# Patient Record
Sex: Male | Born: 1990 | Race: Black or African American | Hispanic: No | Marital: Single | Smoking: Current every day smoker
Health system: Southern US, Community
[De-identification: ages and names within clinical notes are randomized; demographics above are authoritative.]

## PROBLEM LIST (undated history)

## (undated) DIAGNOSIS — I10 Essential (primary) hypertension: Secondary | ICD-10-CM

---

## 2010-07-07 ENCOUNTER — Emergency Department: Payer: Self-pay | Admitting: Emergency Medicine

## 2018-11-28 ENCOUNTER — Encounter: Payer: Self-pay | Admitting: Emergency Medicine

## 2018-11-28 ENCOUNTER — Other Ambulatory Visit: Payer: Self-pay

## 2018-11-28 ENCOUNTER — Emergency Department
Admission: EM | Admit: 2018-11-28 | Discharge: 2018-11-28 | Disposition: A | Discharge status: Home / Self Care | Payer: Self-pay | Attending: Emergency Medicine | Admitting: Emergency Medicine

## 2018-11-28 DIAGNOSIS — Y9389 Activity, other specified: Secondary | ICD-10-CM | POA: Insufficient documentation

## 2018-11-28 DIAGNOSIS — S0083XA Contusion of other part of head, initial encounter: Secondary | ICD-10-CM

## 2018-11-28 DIAGNOSIS — Y92009 Unspecified place in unspecified non-institutional (private) residence as the place of occurrence of the external cause: Secondary | ICD-10-CM | POA: Insufficient documentation

## 2018-11-28 DIAGNOSIS — S0181XA Laceration without foreign body of other part of head, initial encounter: Secondary | ICD-10-CM | POA: Insufficient documentation

## 2018-11-28 DIAGNOSIS — Z880 Allergy status to penicillin: Secondary | ICD-10-CM | POA: Insufficient documentation

## 2018-11-28 DIAGNOSIS — S60211A Contusion of right wrist, initial encounter: Secondary | ICD-10-CM | POA: Insufficient documentation

## 2018-11-28 DIAGNOSIS — W501XXA Accidental kick by another person, initial encounter: Secondary | ICD-10-CM | POA: Insufficient documentation

## 2018-11-28 DIAGNOSIS — Y999 Unspecified external cause status: Secondary | ICD-10-CM | POA: Insufficient documentation

## 2018-11-28 DIAGNOSIS — F17200 Nicotine dependence, unspecified, uncomplicated: Secondary | ICD-10-CM | POA: Insufficient documentation
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
## 2018-11-28 NOTE — ED Notes (Signed)
Cleaned 2cm laceration on patients forehead with clorhexidine and normal saline. Pt tolerated procedure well.

## 2018-11-28 NOTE — Discharge Instructions (Addendum)
The laceration on your forehead was closed with skin adhesive.  Please read through the included information about appropriate wound care.  In short, try to keep it dry for at least 24 hours and after that you may shower but do not scrub out the wound.  The adhesive should fall off in a week or more.  If it falls off early, just allow the wound to heal naturally.  You can use over-the-counter ibuprofen and/or Tylenol as needed for discomfort and if possible you can use cold packs on the contusion on your wrist.  It would be better to wait at least 24 hours before using the cold packs on your forehead so that you avoid getting the adhesive wet, but if it is possible to put on a cold compress without any moisture you can do so on the forehead.

## 2018-11-28 NOTE — ED Notes (Signed)
Pt unable to sign discharge, he is in custody and handcuffed. Pt left ED in custody of [employer].
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
## 2019-05-19 ENCOUNTER — Ambulatory Visit (LOCAL_COMMUNITY_HEALTH_CENTER): Payer: Self-pay

## 2019-05-19 ENCOUNTER — Other Ambulatory Visit: Payer: Self-pay

## 2019-05-19 DIAGNOSIS — Z111 Encounter for screening for respiratory tuberculosis: Secondary | ICD-10-CM

## 2019-05-22 ENCOUNTER — Ambulatory Visit (LOCAL_COMMUNITY_HEALTH_CENTER): Payer: Self-pay

## 2019-05-22 ENCOUNTER — Other Ambulatory Visit: Payer: Self-pay

## 2019-05-22 DIAGNOSIS — Z111 Encounter for screening for respiratory tuberculosis: Secondary | ICD-10-CM

## 2019-05-22 LAB — TB SKIN TEST
Induration: 0 mm
TB Skin Test: NEGATIVE

## 2019-06-25 ENCOUNTER — Ambulatory Visit: Payer: Self-pay

## 2020-04-28 ENCOUNTER — Emergency Department: Payer: Self-pay

## 2020-04-28 ENCOUNTER — Other Ambulatory Visit: Payer: Self-pay

## 2020-04-28 ENCOUNTER — Emergency Department
Admission: EM | Admit: 2020-04-28 | Discharge: 2020-04-28 | Disposition: A | Discharge status: Other Acute Inpatient Hospital | Payer: Self-pay | Attending: Emergency Medicine | Admitting: Emergency Medicine

## 2020-04-28 ENCOUNTER — Encounter: Payer: Self-pay | Admitting: Emergency Medicine

## 2020-04-28 DIAGNOSIS — F17200 Nicotine dependence, unspecified, uncomplicated: Secondary | ICD-10-CM | POA: Insufficient documentation

## 2020-04-28 DIAGNOSIS — S02602A Fracture of unspecified part of body of left mandible, initial encounter for closed fracture: Secondary | ICD-10-CM | POA: Insufficient documentation

## 2020-04-28 DIAGNOSIS — S02652B Fracture of angle of left mandible, initial encounter for open fracture: Secondary | ICD-10-CM | POA: Insufficient documentation

## 2020-04-28 LAB — CBC WITH DIFFERENTIAL/PLATELET
Abs Immature Granulocytes: 0.03 10*3/uL (ref 0.00–0.07)
Basophils Absolute: 0.1 10*3/uL (ref 0.0–0.1)
Basophils Relative: 0 %
Eosinophils Absolute: 0 10*3/uL (ref 0.0–0.5)
Eosinophils Relative: 0 %
HCT: 47.4 % (ref 39.0–52.0)
Hemoglobin: 16.7 g/dL (ref 13.0–17.0)
Immature Granulocytes: 0 %
Lymphocytes Relative: 8 %
Lymphs Abs: 1.1 10*3/uL (ref 0.7–4.0)
MCH: 32.3 pg (ref 26.0–34.0)
MCHC: 35.2 g/dL (ref 30.0–36.0)
MCV: 91.7 fL (ref 80.0–100.0)
Monocytes Absolute: 1.1 10*3/uL — ABNORMAL HIGH (ref 0.1–1.0)
Monocytes Relative: 8 %
Neutro Abs: 10.5 10*3/uL — ABNORMAL HIGH (ref 1.7–7.7)
Neutrophils Relative %: 84 %
Platelets: 244 10*3/uL (ref 150–400)
RBC: 5.17 MIL/uL (ref 4.22–5.81)
RDW: 12.4 % (ref 11.5–15.5)
WBC: 12.7 10*3/uL — ABNORMAL HIGH (ref 4.0–10.5)
nRBC: 0 % (ref 0.0–0.2)

## 2020-04-28 LAB — BASIC METABOLIC PANEL
Anion gap: 11 (ref 5–15)
BUN: 6 mg/dL (ref 6–20)
CO2: 26 mmol/L (ref 22–32)
Calcium: 9 mg/dL (ref 8.9–10.3)
Chloride: 101 mmol/L (ref 98–111)
Creatinine, Ser: 0.87 mg/dL (ref 0.61–1.24)
GFR calc Af Amer: >60 mL/min (ref >60)
GFR calc non Af Amer: >60 mL/min (ref >60)
Glucose, Bld: 83 mg/dL (ref 70–99)
Potassium: 3.9 mmol/L (ref 3.5–5.1)
Sodium: 138 mmol/L (ref 135–145)

## 2020-04-28 MED ORDER — ONDANSETRON HCL 4 MG/2ML IJ SOLN
4.0000 mg | Freq: Once | INTRAMUSCULAR | Status: AC
  Administered 2020-04-28: 4 mg via INTRAVENOUS
  Filled 2020-04-28: qty 2

## 2020-04-28 MED ORDER — CLINDAMYCIN PHOSPHATE 600 MG/50ML IV SOLN
600.0000 mg | Freq: Once | INTRAVENOUS | Status: AC
  Administered 2020-04-28: 600 mg via INTRAVENOUS
  Filled 2020-04-28: qty 50

## 2020-04-28 MED ORDER — MORPHINE SULFATE (PF) 4 MG/ML IV SOLN
4.0000 mg | Freq: Once | INTRAVENOUS | Status: AC
  Administered 2020-04-28: 4 mg via INTRAVENOUS
  Filled 2020-04-28: qty 1
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
## 2020-04-28 NOTE — ED Notes (Signed)
Called [facility] for transport to [place] room [phone]
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
## 2020-04-28 NOTE — ED Notes (Signed)
Back from CT scan and x-ray  Family at bedside

## 2020-12-09 ENCOUNTER — Emergency Department (HOSPITAL_COMMUNITY): Payer: Self-pay

## 2020-12-09 ENCOUNTER — Emergency Department
Admission: EM | Admit: 2020-12-09 | Discharge: 2020-12-09 | Disposition: A | Discharge status: Other Acute Inpatient Hospital | Payer: Self-pay | Attending: Emergency Medicine | Admitting: Emergency Medicine

## 2020-12-09 ENCOUNTER — Emergency Department (HOSPITAL_COMMUNITY)
Admission: EM | Admit: 2020-12-09 | Discharge: 2020-12-09 | Disposition: A | Discharge status: Home / Self Care | Payer: Self-pay | Attending: Emergency Medicine | Admitting: Emergency Medicine

## 2020-12-09 ENCOUNTER — Emergency Department: Payer: Self-pay

## 2020-12-09 ENCOUNTER — Other Ambulatory Visit: Payer: Self-pay

## 2020-12-09 DIAGNOSIS — S40812A Abrasion of left upper arm, initial encounter: Secondary | ICD-10-CM | POA: Insufficient documentation

## 2020-12-09 DIAGNOSIS — S40811A Abrasion of right upper arm, initial encounter: Secondary | ICD-10-CM | POA: Insufficient documentation

## 2020-12-09 DIAGNOSIS — F17200 Nicotine dependence, unspecified, uncomplicated: Secondary | ICD-10-CM | POA: Insufficient documentation

## 2020-12-09 DIAGNOSIS — D1803 Hemangioma of intra-abdominal structures: Secondary | ICD-10-CM

## 2020-12-09 DIAGNOSIS — R4689 Other symptoms and signs involving appearance and behavior: Secondary | ICD-10-CM

## 2020-12-09 DIAGNOSIS — S80812A Abrasion, left lower leg, initial encounter: Secondary | ICD-10-CM | POA: Insufficient documentation

## 2020-12-09 DIAGNOSIS — X58XXXA Exposure to other specified factors, initial encounter: Secondary | ICD-10-CM | POA: Insufficient documentation

## 2020-12-09 DIAGNOSIS — F29 Unspecified psychosis not due to a substance or known physiological condition: Secondary | ICD-10-CM | POA: Insufficient documentation

## 2020-12-09 DIAGNOSIS — R451 Restlessness and agitation: Secondary | ICD-10-CM | POA: Insufficient documentation

## 2020-12-09 DIAGNOSIS — F1410 Cocaine abuse, uncomplicated: Secondary | ICD-10-CM

## 2020-12-09 DIAGNOSIS — M79601 Pain in right arm: Secondary | ICD-10-CM

## 2020-12-09 DIAGNOSIS — S36113A Laceration of liver, unspecified degree, initial encounter: Secondary | ICD-10-CM | POA: Insufficient documentation

## 2020-12-09 DIAGNOSIS — S0990XA Unspecified injury of head, initial encounter: Secondary | ICD-10-CM | POA: Insufficient documentation

## 2020-12-09 DIAGNOSIS — S0181XA Laceration without foreign body of other part of head, initial encounter: Secondary | ICD-10-CM | POA: Insufficient documentation

## 2020-12-09 DIAGNOSIS — F1490 Cocaine use, unspecified, uncomplicated: Secondary | ICD-10-CM

## 2020-12-09 DIAGNOSIS — Z23 Encounter for immunization: Secondary | ICD-10-CM | POA: Insufficient documentation

## 2020-12-09 DIAGNOSIS — S80811A Abrasion, right lower leg, initial encounter: Secondary | ICD-10-CM | POA: Insufficient documentation

## 2020-12-09 DIAGNOSIS — T1490XA Injury, unspecified, initial encounter: Secondary | ICD-10-CM

## 2020-12-09 DIAGNOSIS — Z20822 Contact with and (suspected) exposure to covid-19: Secondary | ICD-10-CM | POA: Insufficient documentation

## 2020-12-09 DIAGNOSIS — F23 Brief psychotic disorder: Secondary | ICD-10-CM

## 2020-12-09 DIAGNOSIS — F19251 Other psychoactive substance dependence with psychoactive substance-induced psychotic disorder with hallucinations: Secondary | ICD-10-CM | POA: Insufficient documentation

## 2020-12-09 DIAGNOSIS — F10929 Alcohol use, unspecified with intoxication, unspecified: Secondary | ICD-10-CM | POA: Insufficient documentation

## 2020-12-09 LAB — COMPREHENSIVE METABOLIC PANEL
ALT: 77 U/L — ABNORMAL HIGH (ref 0–44)
AST: 94 U/L — ABNORMAL HIGH (ref 15–41)
Albumin: 4.9 g/dL (ref 3.5–5.0)
Alkaline Phosphatase: 73 U/L (ref 38–126)
Anion gap: 18 — ABNORMAL HIGH (ref 5–15)
BUN: 8 mg/dL (ref 6–20)
CO2: 18 mmol/L — ABNORMAL LOW (ref 22–32)
Calcium: 9.2 mg/dL (ref 8.9–10.3)
Chloride: 103 mmol/L (ref 98–111)
Creatinine, Ser: 1.2 mg/dL (ref 0.61–1.24)
GFR, Estimated: >60 mL/min (ref >60)
Glucose, Bld: 91 mg/dL (ref 70–99)
Potassium: 3.5 mmol/L (ref 3.5–5.1)
Sodium: 139 mmol/L (ref 135–145)
Total Bilirubin: 1.8 mg/dL — ABNORMAL HIGH (ref 0.3–1.2)
Total Protein: 7.9 g/dL (ref 6.5–8.1)

## 2020-12-09 LAB — TYPE AND SCREEN
ABO/RH(D): B POS
ABO/RH(D): B POS
Antibody Screen: NEGATIVE
Antibody Screen: NEGATIVE

## 2020-12-09 LAB — CBC WITH DIFFERENTIAL/PLATELET
Abs Immature Granulocytes: 0.05 K/uL (ref 0.00–0.07)
Basophils Absolute: 0 K/uL (ref 0.0–0.1)
Basophils Relative: 0 %
Eosinophils Absolute: 0 K/uL (ref 0.0–0.5)
Eosinophils Relative: 0 %
HCT: 47 % (ref 39.0–52.0)
Hemoglobin: 16.4 g/dL (ref 13.0–17.0)
Immature Granulocytes: 0 %
Lymphocytes Relative: 15 %
Lymphs Abs: 2.6 K/uL (ref 0.7–4.0)
MCH: 30.8 pg (ref 26.0–34.0)
MCHC: 34.9 g/dL (ref 30.0–36.0)
MCV: 88.2 fL (ref 80.0–100.0)
Monocytes Absolute: 1.6 K/uL — ABNORMAL HIGH (ref 0.1–1.0)
Monocytes Relative: 9 %
Neutro Abs: 13.1 K/uL — ABNORMAL HIGH (ref 1.7–7.7)
Neutrophils Relative %: 76 %
Platelets: 260 K/uL (ref 150–400)
RBC: 5.33 MIL/uL (ref 4.22–5.81)
RDW: 12.8 % (ref 11.5–15.5)
WBC: 17.3 K/uL — ABNORMAL HIGH (ref 4.0–10.5)
nRBC: 0 % (ref 0.0–0.2)

## 2020-12-09 LAB — URINE DRUG SCREEN, QUALITATIVE (ARMC ONLY)
Amphetamines, Ur Screen: NOT DETECTED
Barbiturates, Ur Screen: NOT DETECTED
Benzodiazepine, Ur Scrn: NOT DETECTED
Cannabinoid 50 Ng, Ur ~~LOC~~: NOT DETECTED
Cocaine Metabolite,Ur ~~LOC~~: POSITIVE — AB
MDMA (Ecstasy)Ur Screen: NOT DETECTED
Methadone Scn, Ur: NOT DETECTED
Opiate, Ur Screen: NOT DETECTED
Phencyclidine (PCP) Ur S: NOT DETECTED
Tricyclic, Ur Screen: NOT DETECTED

## 2020-12-09 LAB — ETHANOL: Alcohol, Ethyl (B): 191 mg/dL — ABNORMAL HIGH

## 2020-12-09 LAB — RESP PANEL BY RT-PCR (FLU A&B, COVID) ARPGX2
Influenza A by PCR: NEGATIVE
Influenza B by PCR: NEGATIVE
SARS Coronavirus 2 by RT PCR: NEGATIVE

## 2020-12-09 LAB — ACETAMINOPHEN LEVEL: Acetaminophen (Tylenol), Serum: <10 ug/mL — ABNORMAL LOW (ref 10–30)

## 2020-12-09 LAB — SALICYLATE LEVEL: Salicylate Lvl: <7 mg/dL — ABNORMAL LOW (ref 7.0–30.0)

## 2020-12-09 MED ORDER — LORAZEPAM 2 MG/ML IJ SOLN
INTRAMUSCULAR | Status: AC
  Administered 2020-12-09: 1 mg via INTRAVENOUS
  Filled 2020-12-09: qty 1

## 2020-12-09 MED ORDER — LORAZEPAM 2 MG/ML IJ SOLN: 1.0000 mg | Freq: Once | INTRAMUSCULAR | Status: AC

## 2020-12-09 MED ORDER — LORAZEPAM 2 MG/ML IJ SOLN
2.0000 mg | Freq: Once | INTRAMUSCULAR | Status: AC
  Administered 2020-12-09: 2 mg via INTRAMUSCULAR
  Filled 2020-12-09: qty 1

## 2020-12-09 MED ORDER — ZIPRASIDONE MESYLATE 20 MG IM SOLR
20.0000 mg | Freq: Once | INTRAMUSCULAR | Status: AC
  Administered 2020-12-09: 20 mg via INTRAMUSCULAR

## 2020-12-09 MED ORDER — TETANUS-DIPHTH-ACELL PERTUSSIS 5-2.5-18.5 LF-MCG/0.5 IM SUSY
0.5000 mL | PREFILLED_SYRINGE | Freq: Once | INTRAMUSCULAR | Status: AC
  Administered 2020-12-09: 0.5 mL via INTRAMUSCULAR
  Filled 2020-12-09: qty 0.5

## 2020-12-09 MED ORDER — DIPHENHYDRAMINE HCL 50 MG/ML IJ SOLN
50.0000 mg | Freq: Once | INTRAMUSCULAR | Status: AC
  Administered 2020-12-09: 50 mg via INTRAMUSCULAR
  Filled 2020-12-09: qty 1

## 2020-12-09 MED ORDER — IOHEXOL 300 MG/ML  SOLN
100.0000 mL | Freq: Once | INTRAMUSCULAR | Status: AC | PRN
  Administered 2020-12-09: 100 mL via INTRAVENOUS

## 2020-12-09 MED ORDER — GADOBUTROL 1 MMOL/ML IV SOLN
6.0000 mL | Freq: Once | INTRAVENOUS | Status: AC | PRN
  Administered 2020-12-09: 6 mL via INTRAVENOUS

## 2020-12-09 NOTE — ED Notes (Signed)
TTS to bedside.

## 2020-12-09 NOTE — ED Notes (Signed)
Pt handed sandwich, graham crackers, and soda. Pt calm and cooperative. Will continue to monitor.
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
## 2020-12-09 NOTE — ED Notes (Signed)
ED Provider at bedside and rescinding IVC papers; [facility] has been contacted to verify if rescind can be done from [facility] in [place] although IVC done in [place]; Patient has been updated by EDP of impending discharge-Monique,RN
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
## 2020-12-09 NOTE — ED Provider Notes (Signed)
Pt has been psych cleared, so his IVC has been rescinded.  Pt has been cooperative while here.  Agitation likely from cocaine.   Isla Pence, MD 12/09/20 [phone]

## 2020-12-09 NOTE — BH Assessment (Signed)
Jodi Marble, RN notified that per Harriett Sine, NP, patient is psychiatrically cleared and recommended for discharge. RN is not working with pt at this time but will inform pt current RN of disposition.

## 2020-12-09 NOTE — ED Notes (Signed)
Belongings placed in locker #3.
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
## 2020-12-09 NOTE — ED Notes (Addendum)
MD made aware that pt IVC paperwork was placed in [facility].

## 2020-12-09 NOTE — ED Notes (Signed)
Patient transported to MRI, sitter present.

## 2020-12-09 NOTE — ED Notes (Signed)
Pt awake , calm at this time . Pt oriented to place by staff. Sitter at bedside.

## 2020-12-09 NOTE — ED Notes (Signed)
This RN spoke with [employer] of [place] to confirm what needs to be done regarding IVC; It has been confirmed that IVC papers are good across [employer] and can be rescinded in any county; Rescind has been faxed to [employer] and per [employer] in [place] nothing is needed on their end if he  has been psy cleared-Monique,RN
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
## 2020-12-09 NOTE — ED Notes (Signed)
[facility] here to transport patient to [place]. VSS updated, Receiving nurse called and aware patient is leaving [facility] ed.
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
## 2020-12-09 NOTE — ED Notes (Signed)
Pt dressed out by this RN and raquel RN into burgundy scrubs. Pt belonging placed into bag. Belongings include: White button up shirt Blue jeans One pair of black tennis shoes Black boxers One cell phone 2 cards

## 2020-12-09 NOTE — ED Notes (Signed)
Pt continues to get up and down from bed removing monitors and gown. Pt out in hallway. States he needed to urinate. Pt asked to provide urine sample, pt refused. Due to argumentative and aggressive status, RN did not press the issue. Pt back in room and in bed at this time

## 2020-12-09 NOTE — ED Notes (Addendum)
When in CT pt sat up on CT stretcher and attempting to get up. Staff unable to keep pt on stretcher that time. Pt up unsteady on feet. Pt stated he needed to urinate. Pt given urinal and urinated in it after walking around room several times attempting to leave room. When pt back on stretcher he was still unable to remain still enough to obtain adequate scans. Charge RN contacted and 1mg  IV ativan brought to CT and administered by Raquel at [place]. CT completed and pt transferred to quad at this time. Report given to [employer]

## 2020-12-09 NOTE — ED Notes (Signed)
IVC/ No Consult ordered at this time
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
## 2021-01-22 ENCOUNTER — Other Ambulatory Visit: Payer: Self-pay

## 2021-01-22 ENCOUNTER — Emergency Department
Admission: EM | Admit: 2021-01-22 | Discharge: 2021-01-23 | Disposition: A | Discharge status: Home / Self Care | Payer: Self-pay | Attending: Emergency Medicine | Admitting: Emergency Medicine

## 2021-01-22 ENCOUNTER — Emergency Department: Payer: Self-pay

## 2021-01-22 DIAGNOSIS — F1911 Other psychoactive substance abuse, in remission: Secondary | ICD-10-CM | POA: Diagnosis present

## 2021-01-22 DIAGNOSIS — Z20822 Contact with and (suspected) exposure to covid-19: Secondary | ICD-10-CM | POA: Insufficient documentation

## 2021-01-22 DIAGNOSIS — R45851 Suicidal ideations: Secondary | ICD-10-CM | POA: Insufficient documentation

## 2021-01-22 DIAGNOSIS — Y92149 Unspecified place in prison as the place of occurrence of the external cause: Secondary | ICD-10-CM | POA: Insufficient documentation

## 2021-01-22 DIAGNOSIS — F17200 Nicotine dependence, unspecified, uncomplicated: Secondary | ICD-10-CM | POA: Insufficient documentation

## 2021-01-22 DIAGNOSIS — F10959 Alcohol use, unspecified with alcohol-induced psychotic disorder, unspecified: Secondary | ICD-10-CM | POA: Diagnosis present

## 2021-01-22 DIAGNOSIS — S1093XA Contusion of unspecified part of neck, initial encounter: Secondary | ICD-10-CM | POA: Insufficient documentation

## 2021-01-22 DIAGNOSIS — F19251 Other psychoactive substance dependence with psychoactive substance-induced psychotic disorder with hallucinations: Secondary | ICD-10-CM | POA: Insufficient documentation

## 2021-01-22 DIAGNOSIS — I10 Essential (primary) hypertension: Secondary | ICD-10-CM | POA: Insufficient documentation

## 2021-01-22 DIAGNOSIS — S0083XA Contusion of other part of head, initial encounter: Secondary | ICD-10-CM | POA: Insufficient documentation

## 2021-01-22 HISTORY — DX: Essential (primary) hypertension: I10

## 2021-01-22 LAB — ACETAMINOPHEN LEVEL: Acetaminophen (Tylenol), Serum: <10 ug/mL — ABNORMAL LOW (ref 10–30)

## 2021-01-22 LAB — CBC
HCT: 44.3 % (ref 39.0–52.0)
Hemoglobin: 15.1 g/dL (ref 13.0–17.0)
MCH: 30.6 pg (ref 26.0–34.0)
MCHC: 34.1 g/dL (ref 30.0–36.0)
MCV: 89.7 fL (ref 80.0–100.0)
Platelets: 273 10*3/uL (ref 150–400)
RBC: 4.94 MIL/uL (ref 4.22–5.81)
RDW: 13.2 % (ref 11.5–15.5)
WBC: 8.8 10*3/uL (ref 4.0–10.5)
nRBC: 0 % (ref 0.0–0.2)

## 2021-01-22 LAB — COMPREHENSIVE METABOLIC PANEL
ALT: 26 U/L (ref 0–44)
AST: 23 U/L (ref 15–41)
Albumin: 4.8 g/dL (ref 3.5–5.0)
Alkaline Phosphatase: 73 U/L (ref 38–126)
Anion gap: 13 (ref 5–15)
BUN: 7 mg/dL (ref 6–20)
CO2: 22 mmol/L (ref 22–32)
Calcium: 8.9 mg/dL (ref 8.9–10.3)
Chloride: 105 mmol/L (ref 98–111)
Creatinine, Ser: 0.92 mg/dL (ref 0.61–1.24)
GFR, Estimated: >60 mL/min (ref >60)
Glucose, Bld: 93 mg/dL (ref 70–99)
Potassium: 3.9 mmol/L (ref 3.5–5.1)
Sodium: 140 mmol/L (ref 135–145)
Total Bilirubin: 1 mg/dL (ref 0.3–1.2)
Total Protein: 7.9 g/dL (ref 6.5–8.1)

## 2021-01-22 LAB — ETHANOL: Alcohol, Ethyl (B): 160 mg/dL — ABNORMAL HIGH (ref <10)

## 2021-01-22 LAB — TROPONIN I (HIGH SENSITIVITY): Troponin I (High Sensitivity): 8 ng/L (ref <18)

## 2021-01-22 LAB — SALICYLATE LEVEL: Salicylate Lvl: <7 mg/dL — ABNORMAL LOW (ref 7.0–30.0)

## 2021-01-22 MED ORDER — AMLODIPINE BESYLATE 5 MG PO TABS
5.0000 mg | ORAL_TABLET | Freq: Once | ORAL | Status: AC
  Administered 2021-01-22: 5 mg via ORAL
  Filled 2021-01-22: qty 1
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
## 2021-01-22 NOTE — ED Notes (Signed)
Pt was dressed out by this tech and [employer].  Pt belongings:  Long sleeve black shirt Short sleeve black shirt Black pants Black socks Grey shoes   Pt would not remove underwear, so he is still wearing his grey boxers.

## 2021-01-22 NOTE — ED Notes (Signed)
Pt initially refused blood draw despite warrant. AC notified.   Pt calm and cooperative to hospital staff at time of blood draw. [employer] at bedside. R AC cleaned with betadine solution, skin dry at time of puncture. Chain of custody maintained with [employer] officer cole. Pt calm and cooperative at time of blood draw, allowing this RN to draw blood.
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
## 2021-01-22 NOTE — ED Notes (Signed)
Officer gave this RN a black cell phone and 2 pieces of paperwork.  These items added to pt's belonging bag.

## 2021-01-22 NOTE — ED Notes (Signed)
Patient resting quietly in room. No noted distress or abnormal behaviors noted. Will continue 15 minute checks and observation by security camera for safety. 

## 2021-01-22 NOTE — ED Notes (Signed)
IVC/pending reassessment in AM/under [employer] custody

## 2021-01-22 NOTE — ED Notes (Addendum)
Patient given phone to speak with this RN. Patient's mother updated this RN after receiving permission from patient.   Patient's mother informed this RN that patient has history of being aggressive, substance use including "uknown substances that he ingested and alcohol". She states "he had an apartment and was doing fine, had a breakup and stopped paying for his apartment and car and things went down hill". Patient's mother believes that he is self medicating to "deal with the issues that he has".
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
## 2021-01-22 NOTE — ED Notes (Signed)
Pt dressed out with tech, [employer]. Pt cooperative but mumbling comments to himself. Pt asked about when psych would see him. This RN made pt aware that we were moving him over to the psych area of the hospital where psych will come see him as soon as possible.   Pt escorted by security to room 21 at this time.

## 2021-01-22 NOTE — ED Notes (Signed)
Pt standing in door of room. Pt asked to step inside the room. Pt took one step back but refuses to have a seat on bed in room. Pt states 'the air quality in this room is bad for my health and it's cold'. Pt offered blanket by this RN. When asked if he was having suicidal thoughts, pt states 'I'm not gonna kill myself. I'm just using this as a scapegoat. I'm not hearing or seeing anything.' Pt denies homicidal thoughts. Pt states 'It would be better for my mental wellbeing if those two cops weren't sitting out there laughing and discussing my court case'. Pt informed that police were in the area for the safety of him and the staff here.
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
## 2021-01-23 LAB — RESP PANEL BY RT-PCR (FLU A&B, COVID) ARPGX2
Influenza A by PCR: NEGATIVE
Influenza B by PCR: NEGATIVE
SARS Coronavirus 2 by RT PCR: NEGATIVE

## 2021-01-23 LAB — POC SARS CORONAVIRUS 2 AG -  ED: SARS Coronavirus 2 Ag: NEGATIVE

## 2021-01-23 NOTE — ED Notes (Signed)
Pt given chocolate ice cream and the phone.

## 2021-01-23 NOTE — ED Notes (Signed)
Hourly rounding reveals patient in room. No complaints, stable, in no acute distress. Q15 minute rounds and monitoring via Security Cameras to continue. 

## 2021-01-23 NOTE — ED Notes (Signed)
Report given to [facility], Therapist, sports.

## 2021-01-23 NOTE — ED Notes (Signed)
Pt. Transferred to [facility] from ED to room 2 after screening for contraband. Report to include Situation, Background, Assessment and Recommendations from [facility]. Pt. Oriented to unit including Q15 minute rounds as well as the security cameras for their protection. Patient is alert and oriented, warm and dry in no acute distress. Patient denies SI, HI, and AVH. Pt. Encouraged to let me know if needs arise.

## 2021-01-23 NOTE — ED Provider Notes (Signed)
Emergency Medicine Observation Re-evaluation Note  Jeffrey Montoya is a 30 y.o. male, seen on rounds today.  Pt initially presented to the ED for complaints of Hypertension Currently, the patient is resting comfortably.  Physical Exam  BP 119/80 (BP Location: Right Arm)   Pulse (!) 58   Temp 98.2 F (36.8 C) (Oral)   Resp 18   Ht 6\' 1"  (1.854 m)   Wt 62.1 kg   SpO2 98%   BMI 18.07 kg/m  Physical Exam General: Comfortable appearing. Cardiac: Good peripheral perfusion. Lungs: Normal respiratory effort. Psych: Calm and cooperative.  ED Course / MDM  EKG:   I have reviewed the labs performed to date as well as medications administered while in observation.  There have been no significant changes in the last 24 hours.  The patient is under involuntary commitment and pending psychiatry reassessment.  Plan  Current plan is for disposition per psychiatry team recommendations. Patient is under full IVC at this time.   Arta Silence, MD 01/23/21 [phone]

## 2021-01-23 NOTE — ED Notes (Signed)
Patient is stable in NAD. He is discharged to home via grandmother. Discharge instruction given. Patient verbalized understanding.Patient belonging given at discharge. No issues.

## 2021-01-23 NOTE — BH Assessment (Signed)
Per Ysidro Evert, NP pt psych cleared as of 01/22/21.   Per Dr. Tamala Julian, pt will need to be reassessed and IVC rescinded by Dr. Weber Cooks tomorrow.

## 2021-01-23 NOTE — Discharge Instructions (Addendum)
You have been seen in the emergency department for a  psychiatric concern. You have been evaluated both medically as well as psychiatrically. Please follow-up with your outpatient resources provided. Return to the emergency department for any worsening symptoms, or any thoughts of hurting yourself or anyone else so that we may attempt to help you. 

## 2021-01-23 NOTE — ED Notes (Signed)
Lunch provided.

## 2021-01-23 NOTE — ED Notes (Signed)
Dinner tray with drink given to patient.

## 2021-01-23 NOTE — ED Provider Notes (Signed)
-----------------------------------------   9:31 PM on 01/23/2021 -----------------------------------------  Patient was seen evaluated by psychiatry overnight last night.  They believe the patient is safe for discharge home from psychiatric standpoint but did not have a physician in house to rescind the IVC.  I have personally seen and evaluated the patient today he remains calm cooperative.  Patient now states he wishes to go home.  We will discharge patient home I will rescind the IVC.  Patient's medical work-up is been largely nonrevealing.   Harvest Dark, MD 01/23/21 2131
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
